# Patient Record
Sex: Male | Born: 1960 | Race: Black or African American | Hispanic: No | Marital: Married | Smoking: Former smoker
Health system: Southern US, Community
[De-identification: ages and names within clinical notes are randomized; demographics above are authoritative.]

---

## 2013-02-25 ENCOUNTER — Emergency Department (HOSPITAL_COMMUNITY)
Admission: EM | Admit: 2013-02-25 | Discharge: 2013-02-25 | Disposition: A | Discharge status: Home / Self Care | Payer: Self-pay | Attending: Emergency Medicine | Admitting: Emergency Medicine

## 2013-02-25 ENCOUNTER — Encounter (HOSPITAL_COMMUNITY): Payer: Self-pay | Admitting: Emergency Medicine

## 2013-02-25 DIAGNOSIS — R220 Localized swelling, mass and lump, head: Secondary | ICD-10-CM | POA: Insufficient documentation

## 2013-02-25 DIAGNOSIS — K029 Dental caries, unspecified: Secondary | ICD-10-CM | POA: Insufficient documentation

## 2013-02-25 DIAGNOSIS — Z87891 Personal history of nicotine dependence: Secondary | ICD-10-CM | POA: Insufficient documentation

## 2013-02-25 MED ORDER — SODIUM CHLORIDE 0.9 % IV BOLUS (SEPSIS)
1000.0000 mL | Freq: Once | INTRAVENOUS | Status: AC
  Administered 2013-02-25: 1000 mL via INTRAVENOUS

## 2013-02-25 MED ORDER — FAMOTIDINE IN NACL 20-0.9 MG/50ML-% IV SOLN
20.0000 mg | Freq: Once | INTRAVENOUS | Status: AC
  Administered 2013-02-25: 20 mg via INTRAVENOUS
  Filled 2013-02-25: qty 50

## 2013-02-25 MED ORDER — DIPHENHYDRAMINE HCL 50 MG/ML IJ SOLN
25.0000 mg | Freq: Once | INTRAMUSCULAR | Status: AC
  Administered 2013-02-25: 25 mg via INTRAVENOUS
  Filled 2013-02-25: qty 1

## 2013-02-25 MED ORDER — PENICILLIN V POTASSIUM 500 MG PO TABS: 500.0000 mg | ORAL_TABLET | Freq: Four times a day (QID) | ORAL | Status: AC

## 2013-02-25 MED ORDER — DEXAMETHASONE SODIUM PHOSPHATE 10 MG/ML IJ SOLN
10.0000 mg | Freq: Once | INTRAMUSCULAR | Status: AC
  Administered 2013-02-25: 10 mg via INTRAVENOUS
  Filled 2013-02-25: qty 1

## 2013-02-25 NOTE — ED Provider Notes (Signed)
Medical screening examination/treatment/procedure(s) were conducted as a shared visit with non-physician practitioner(s) and myself.  I personally evaluated the patient during the encounter.  EKG Interpretation   None      Allergic reaction versus infectious process. Patient is hemodynamically stable. Rx antibiotic for tooth infection  Donnetta Hutching, MD 02/25/13 1424

## 2013-02-25 NOTE — ED Notes (Signed)
Pt from home with c/o left side facial swelling starting last night.  This has happened 3 times before just below the left eye and once just above the lip.  Pt reports hx of sinus issues, denies dental pain or vision changes.  Pt in NAD, A&O.

## 2013-02-25 NOTE — ED Provider Notes (Signed)
CSN: [id number]     Arrival date & time 02/25/13  0920 History   First MD Initiated Contact with Patient 02/25/13 [phone]     Chief Complaint  Patient presents with  . Facial Swelling   (Consider location/radiation/quality/duration/timing/severity/associated sxs/prior Treatment) The history is provided by the patient and medical records.   This is a 52 year old male with no significant past medical history presenting to the ED for left facial swelling upon awakening this morning. Patient states he has had multiple episodes of the same after eating new foods. Patient states he did he a new brand of pre-marinated steak last night in a [place] dish his wife made.  States he ate it twice before bed.  Upon waking this morning he had diffuse swelling of the left side of his face.  No neck swelling.  No sensation of throat swelling or SOB.  No difficulty swallowing or speaking.  No dental pain at this time.  No fevers, sweats, or chills.  Pt denies any new medications.  No known allergies.  History reviewed. No pertinent past medical history. History reviewed. No pertinent past surgical history. History reviewed. No pertinent family history. History  Substance Use Topics  . Smoking status: Former Games developer  . Smokeless tobacco: Never Used  . Alcohol Use: 6.0 oz/week    10 Cans of beer per week    Review of Systems  HENT: Positive for facial swelling.   All other systems reviewed and are negative.    Allergies  Review of patient's allergies indicates no known allergies.  Home Medications  No current outpatient prescriptions on file. BP 128/83  Pulse 72  Resp 18  SpO2 99%  Physical Exam  Nursing note and vitals reviewed. Constitutional: He is oriented to person, place, and time. He appears well-developed and well-nourished.  HENT:  Head: Normocephalic and atraumatic.    Nose: Nose normal.  Mouth/Throat: Uvula is midline, oropharynx is clear and moist and mucous membranes are normal.  Abnormal dentition. Dental caries present. No uvula swelling. No oropharyngeal exudate, posterior oropharyngeal edema, posterior oropharyngeal erythema or tonsillar abscesses.  Diffuse left facial swelling from infraorbital region to left mandible without extension into neck; no TTP; overlying skin normal in appearance without erythema, induration, lesions, or hives; no difficulty swallowing; speaking in full complete sentences; airway patent; teeth largely in poor dentition without noted dental abscess  Eyes: Conjunctivae and EOM are normal. Pupils are equal, round, and reactive to light.  Neck: Normal range of motion.  Cardiovascular: Normal rate, regular rhythm and normal heart sounds.   Pulmonary/Chest: Effort normal and breath sounds normal.  Abdominal: Soft. Bowel sounds are normal.  Musculoskeletal: Normal range of motion.  Neurological: He is alert and oriented to person, place, and time.  Skin: Skin is warm and dry.  Psychiatric: He has a normal mood and affect.    ED Course  Procedures (including critical care time) Labs Review Labs Reviewed - No data to display Imaging Review No results found.  EKG Interpretation   None       MDM   1. Left facial swelling    Improvement of facial swelling following meds and fluids.  Pts airway remains clear with no difficulty swallowing, breathing, or speaking.  No concern for ludwig's angina at this time.  Will cover with abx for possible dental source and have pt FU with dentist-- referral given.  Discussed plan with pt and wife, they agreed.  Strict return precautions advised should signs/sx of airway compromise occur.  Discussed with Dr. Adriana Simas who personally evaluated pt and agrees with assessment and plan of care.  Garlon Hatchet, PA-C 02/25/13 1333

## 2014-06-29 ENCOUNTER — Emergency Department (HOSPITAL_COMMUNITY): Payer: Self-pay

## 2014-06-29 ENCOUNTER — Encounter (HOSPITAL_COMMUNITY): Payer: Self-pay | Admitting: Family Medicine

## 2014-06-29 ENCOUNTER — Emergency Department (HOSPITAL_COMMUNITY)
Admission: EM | Admit: 2014-06-29 | Discharge: 2014-06-29 | Disposition: A | Discharge status: Home / Self Care | Payer: Self-pay | Attending: Emergency Medicine | Admitting: Emergency Medicine

## 2014-06-29 DIAGNOSIS — Y9389 Activity, other specified: Secondary | ICD-10-CM | POA: Insufficient documentation

## 2014-06-29 DIAGNOSIS — Z87891 Personal history of nicotine dependence: Secondary | ICD-10-CM | POA: Insufficient documentation

## 2014-06-29 DIAGNOSIS — S134XXA Sprain of ligaments of cervical spine, initial encounter: Secondary | ICD-10-CM | POA: Insufficient documentation

## 2014-06-29 DIAGNOSIS — Y998 Other external cause status: Secondary | ICD-10-CM | POA: Insufficient documentation

## 2014-06-29 DIAGNOSIS — S20211A Contusion of right front wall of thorax, initial encounter: Secondary | ICD-10-CM | POA: Insufficient documentation

## 2014-06-29 DIAGNOSIS — Y92410 Unspecified street and highway as the place of occurrence of the external cause: Secondary | ICD-10-CM | POA: Insufficient documentation

## 2014-06-29 MED ORDER — IBUPROFEN 400 MG PO TABS
800.0000 mg | ORAL_TABLET | Freq: Once | ORAL | Status: AC
  Administered 2014-06-29: 800 mg via ORAL
  Filled 2014-06-29: qty 2

## 2014-06-29 MED ORDER — ONDANSETRON 4 MG PO TBDP
4.0000 mg | ORAL_TABLET | Freq: Once | ORAL | Status: DC
  Filled 2014-06-29: qty 1

## 2014-06-29 MED ORDER — NAPROXEN 375 MG PO TABS: 375.0000 mg | ORAL_TABLET | Freq: Two times a day (BID) | ORAL | Status: AC

## 2014-06-29 MED ORDER — CYCLOBENZAPRINE HCL 10 MG PO TABS
5.0000 mg | ORAL_TABLET | Freq: Once | ORAL | Status: DC
  Filled 2014-06-29: qty 1

## 2014-06-29 MED ORDER — HYDROCODONE-ACETAMINOPHEN 5-325 MG PO TABS: 1.0000 | ORAL_TABLET | ORAL | Status: AC | PRN

## 2014-06-29 MED ORDER — HYDROCODONE-ACETAMINOPHEN 5-325 MG PO TABS
1.0000 | ORAL_TABLET | Freq: Once | ORAL | Status: DC
  Filled 2014-06-29: qty 1

## 2014-06-29 MED ORDER — CYCLOBENZAPRINE HCL 10 MG PO TABS: 5.0000 mg | ORAL_TABLET | Freq: Two times a day (BID) | ORAL | Status: AC | PRN
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
